# Patient Record
Sex: Male | Born: 1970 | Hispanic: Yes | Marital: Single
Health system: Southern US, Community
[De-identification: ages and names within clinical notes are randomized; demographics above are authoritative.]

## PROBLEM LIST (undated history)

## (undated) DIAGNOSIS — H903 Sensorineural hearing loss, bilateral: Secondary | ICD-10-CM

## (undated) DIAGNOSIS — I10 Essential (primary) hypertension: Secondary | ICD-10-CM

## (undated) DIAGNOSIS — H9313 Tinnitus, bilateral: Secondary | ICD-10-CM

---

## 2010-04-07 ENCOUNTER — Ambulatory Visit: Payer: Self-pay | Admitting: Internal Medicine

## 2010-04-07 ENCOUNTER — Encounter: Payer: Self-pay | Admitting: Family Medicine

## 2010-04-07 DIAGNOSIS — I10 Essential (primary) hypertension: Secondary | ICD-10-CM | POA: Insufficient documentation

## 2010-04-07 DIAGNOSIS — E785 Hyperlipidemia, unspecified: Secondary | ICD-10-CM

## 2010-04-10 ENCOUNTER — Ambulatory Visit: Payer: Self-pay | Admitting: Internal Medicine

## 2010-04-11 ENCOUNTER — Encounter: Payer: Self-pay | Admitting: Family Medicine

## 2010-04-11 LAB — CONVERTED CEMR LAB
Albumin: 4.5 g/dL (ref 3.5–5.2)
Alkaline Phosphatase: 59 units/L (ref 39–117)
Bilirubin, Direct: 0.2 mg/dL (ref 0.0–0.3)
Calcium: 9.1 mg/dL (ref 8.4–10.5)
GFR calc non Af Amer: 123.86 mL/min (ref >60)
Glucose, Bld: 113 mg/dL — ABNORMAL HIGH (ref 70–99)
HDL: 33.8 mg/dL — ABNORMAL LOW (ref >39.00)
Sodium: 141 meq/L (ref 135–145)
Total CHOL/HDL Ratio: 6
Triglycerides: 286 mg/dL — ABNORMAL HIGH (ref 0.0–149.0)

## 2010-05-06 ENCOUNTER — Ambulatory Visit: Payer: Self-pay | Admitting: Internal Medicine

## 2010-05-06 DIAGNOSIS — H60399 Other infective otitis externa, unspecified ear: Secondary | ICD-10-CM

## 2010-05-06 DIAGNOSIS — R7309 Other abnormal glucose: Secondary | ICD-10-CM | POA: Insufficient documentation

## 2010-05-06 DIAGNOSIS — E669 Obesity, unspecified: Secondary | ICD-10-CM | POA: Insufficient documentation

## 2010-05-06 HISTORY — DX: Other infective otitis externa, unspecified ear: H60.399

## 2010-09-30 NOTE — Assessment & Plan Note (Signed)
Summary: 4WK FOLLOW UP / LFW   Vital Signs:  Patient profile:   39 year old male Weight:      225.75 pounds Temp:     97.9 degrees F oral Pulse rate:   72 / minute Pulse rhythm:   regular BP sitting:   124 / 80  (left arm) Cuff size:   large  Vitals Entered By: Selena Batten Dance CMA Duncan Dull) (May 06, 2010 4:25 PM) CC: 4 week follow up   History of Present Illness: CC: f/u previous visit  Never received letter on blood work.  1. dyslipidemia - low HDL, high trig.  not currently on meds.  2. weight - up 4 lbs since last visit.  states hasn't really changed diet or exercise regimen (not doing anything currently).  3. HTN - on amlodipine.  no HA, vision changes, chest pain, tightness, SOB, urinary changes, LE swelling.  compliant with meds.  4. h/o recurrent ear infections - swimmers ears 3-4x/yr esp when out swimming, previously treated with cortisporin.  pt uses EtOH intermittently in ears, stopped H2O2.  5. prediabetes - cbg 113.    Allergies (verified): No Known Drug Allergies  Past History:  Past Medical History: HTN dyslipidemia (elevated trig, low HDL) recurrent otitis externa uses cortisporin ED previously on viagra  Review of Systems       per hpi  Physical Exam  General:  Well-developed,well-nourished,in no acute distress; alert,appropriate and cooperative throughout examination Ears:  External ear exam shows no significant lesions or deformities.  Otoscopic examination reveals tympanic membranes are intact bilaterally without bulging, retraction, inflammation or discharge. Hearing is grossly normal bilaterally.  R>L erythematous ear canals with maceration Neck:  No deformities, masses, or tenderness noted. Lungs:  Normal respiratory effort, chest expands symmetrically. Lungs are clear to auscultation, no crackles or wheezes. Heart:  Normal rate and regular rhythm. S1 and S2 normal without gallop, murmur, click, rub or other extra sounds.   Impression &  Recommendations:  Problem # 1:  HYPERTENSION (ICD-401.9) labs reviewed.  compliant and stable with norvasc 5. His updated medication list for this problem includes:    Norvasc 5 Mg Tabs (Amlodipine besylate) .Marland Kitchen... 1 by mouth once daily  BP today: 124/80 Prior BP: 132/92 (04/07/2010)  Labs Reviewed: K+: 4.3 (04/10/2010) Creat: : 0.7 (04/10/2010)   Chol: 189 (04/10/2010)   HDL: 33.80 (04/10/2010)   LDL: 143 (07/18/2008)   TG: 286.0 (04/10/2010)  Problem # 2:  OBESITY (ICD-278.00) Reviewed blood work, discussed healthy lifestyle changes, specifically mediterranean diet.  discussed implementing exercise into regimen.  pt thinks could start walking with family when he gets home from work.  RTC 1 mo for f/u weight and changes.    Ht: 69.50 (04/07/2010)   Wt: 225.75 (05/06/2010)   BMI: 32.39 (04/07/2010)  Problem # 3:  DYSLIPIDEMIA (ICD-272.4) return 1 mo for f/u.  could consider adding fish oil or niacin.  LDL at goal currently.  Labs Reviewed: SGOT: 20 (04/10/2010)   SGPT: 30 (04/10/2010)   HDL:33.80 (04/10/2010), 27 (07/18/2008)  LDL:143 (07/18/2008), 100 (06/11/2006)  Chol:189 (04/10/2010), 205 (07/18/2008)  Trig:286.0 (04/10/2010), 173 (07/18/2008)  Problem # 4:  OTITIS EXTERNA (ICD-380.10) Discussed symptomatic treatment and preventive measures.   His updated medication list for this problem includes:    Antibiotic Ear 3.5-10000-1 Soln (Neomycin-polymyxin-hc) .Marland Kitchen... Apply into ear 3-4 drops qid as needed ear infection.  label in spanish  Problem # 5:  PREDIABETES (ICD-790.29) cbg 113.  discussed risk of progression.  could consider metformin +/-  A1c. Labs Reviewed: Creat: 0.7 (04/10/2010)     Complete Medication List: 1)  Norvasc 5 Mg Tabs (Amlodipine besylate) .Marland Kitchen.. 1 by mouth once daily 2)  Ibuprofen 600 Mg Tabs (Ibuprofen) .... One by mouth q6 hours as needed pain.  label in spanish 3)  Antibiotic Ear 3.5-10000-1 Soln (Neomycin-polymyxin-hc) .... Apply into ear 3-4 drops qid  as needed ear infection.  label in spanish  Other Orders: Tdap => 98yrs IM (14782) Admin 1st Vaccine (95621) Flu Vaccine 64yrs + (30865) Admin of Any Addtl Vaccine (78469)  Patient Instructions: 1)  Dieta Mediterreana para presion y combatir diabetes. 2)  gotitas para oidos. 3)  Regresar como necesite o en 6 meses. (return in 6 months or as needed) 4)  Gusto verlo hoy.  Llame a clinica con preguntas. Prescriptions: ANTIBIOTIC EAR 3.5-10000-1 SOLN (NEOMYCIN-POLYMYXIN-HC) apply into ear 3-4 drops QID as needed ear infection.  label in spanish  #1 x 1   Entered and Authorized by:   Eustaquio Boyden  MD   Signed by:   Eustaquio Boyden  MD on 05/06/2010   Method used:   Electronically to        [facility] [phone]* (retail)       [address] [place], [place]  [address]       Ph: [phone] or [phone]       Fax: [phone]   RxID:   [id number]   Current Allergies (reviewed today): No known allergies    Prevention & Chronic Care Immunizations   Influenza vaccine: Fluvax 3+  (05/06/2010)   Influenza vaccine due: 05/01/2010    Tetanus booster: 05/06/2010: Tdap   Tetanus booster due: 05/06/2020    Pneumococcal vaccine: Not documented  Other Screening   Smoking status: never  (04/07/2010)  Lipids   Total Cholesterol: 189  (04/10/2010)   LDL: 143  (07/18/2008)   LDL Direct: 93.8  (04/10/2010)   HDL: 33.80  (04/10/2010)   Triglycerides: 286.0  (04/10/2010)    SGOT (AST): 20  (04/10/2010)   SGPT (ALT): 30  (04/10/2010)   Alkaline phosphatase: 59  (04/10/2010)   Total bilirubin: 1.0  (04/10/2010)  Hypertension   Last Blood Pressure: 124 / 80  (05/06/2010)   Serum creatinine: 0.7  (04/10/2010)   Serum potassium 4.3  (04/10/2010)  Self-Management Support :    Hypertension self-management support: Not documented    Lipid self-management support: Not documented    Immunizations Administered:  Tetanus Vaccine:    Vaccine  Type: Tdap    Site: left deltoid    Mfr: GlaxoSmithKline    Dose: 0.5 ml    Route: IM    Given by: Selena Batten Dance CMA (AAMA)    Exp. Date: 05/21/2012    Lot #: [id number]    VIS given: 07/18/08 version given May 06, 2010.  Influenza Vaccine # 1:    Vaccine Type: Fluvax 3+    Site: right deltoid    Mfr: GlaxoSmithKline    Dose: 0.5 ml    Route: IM    Given by: Janee Morn CMA (AAMA)    Exp. Date: 02/28/2011    Lot #: [id number]    VIS given: 03/25/10 version given May 06, 2010.  Flu Vaccine Consent Questions:    Do you have a history of severe allergic reactions to this vaccine? no    Any prior history of allergic reactions to egg and/or gelatin?  no    Do you have a sensitivity to the preservative Thimersol? no    Do you have a past history of Guillan-Barre Syndrome? no    Do you currently have an acute febrile illness? no    Have you ever had a severe reaction to latex? no    Vaccine information given and explained to patient? yes

## 2010-09-30 NOTE — Letter (Signed)
Summary: Out of Work  [facility] [employer] [facility]  [address] [place], [place] [address]   Phone: [phone]  Fax: [phone]    April 07, 2010   Employee:  JADIN CREQUE [facility]    To Whom It May Concern:   For Medical reasons, please excuse the above named employee the morning of:  Start:  April 10, 2010   End:  April 10, 2010   if he is a bit late to work.  He will be coming to clinic for blood work.  If you need additional information, please feel free to contact our office.         Sincerely,    Eustaquio Boyden  MD

## 2010-09-30 NOTE — Letter (Signed)
Summary: Lipid Letter  [place] at [facility]  [address] [place], [place] [address]   Phone: [phone]  Fax: [phone]    04/11/2010  Nathan Frost [address] [place], [place]  [address]  Dear Nathan Frost:  We have carefully reviewed your last lipid profile from  and the results are noted below with a summary of recommendations for lipid management.    Cholesterol:       189     Goal: < 200   HDL "good" Cholesterol:   95.18     Goal: > 40   LDL "bad" Cholesterol:   98     Goal: < 130   Triglycerides:       286.0     Goal: < 150    Sus triglycerides estuvieron altos, y su buen colesterol estuvo bajo.  Para esto recomiendo bajar la cantidad de grasa en la comida, tratar de comer mas frutas y vegetales, y mas pescado en la dieta.  Tambien recomendaria incrementar el ejercicio aerobico que hace regularmente.  Su tyroide, riones, e higado estuvieron completamente normales. Su azucar estuvo un poco alta - esto significa que usted tiene riezgo de desarrollar diabetes.  Seguiremos monitoreando esto de Loss adjuster, chartered.      TLC Diet (Therapeutic Lifestyle Change): Saturated Fats & Transfatty acids should be kept < 7% of total calories ***Reduce Saturated Fats Polyunstaurated Fat can be up to 10% of total calories Monounsaturated Fat Fat can be up to 20% of total calories Total Fat should be no greater than 25-35% of total calories Carbohydrates should be 50-60% of total calories Protein should be approximately 15% of total calories Fiber should be at least 20-30 grams a day ***Increased fiber may help lower LDL Total Cholesterol should be < 200mg /day Consider adding plant stanol/sterols to diet (example: Benacol spread) ***A higher intake of unsaturated fat may reduce Triglycerides and Increase HDL    Adjunctive Measures (may lower LIPIDS and reduce risk of Heart Attack) include: Aerobic Exercise (20-30 minutes 3-4 times a week) Limit Alcohol Consumption Weight  Reduction Aspirin 75-81 mg a day by mouth (if not allergic or contraindicated) Dietary Fiber 20-30 grams a day by mouth     Current Medications: 1)    Norvasc 5 Mg Tabs (Amlodipine besylate) .Marland Kitchen.. 1 by mouth once daily 2)    Ibuprofen 600 Mg Tabs (Ibuprofen) .... One by mouth q6 hours as needed pain.  label in spanish  If you have any questions, please call. We appreciate being able to work with you. Llame a clinica con preguntas.  Sincerely,    [place] at [facility] Eustaquio Boyden  MD  Appended Document: Lipid Letter Mailed to patient

## 2010-09-30 NOTE — Assessment & Plan Note (Signed)
Summary: new patient to est/mk   Vital Signs:  Patient profile:   40 year old male Height:      69.50 inches (176.53 cm) Weight:      221.75 pounds (100.80 kg) BMI:     32.39 Temp:     98.2 degrees F (36.78 degrees C) oral Pulse rate:   80 / minute Pulse rhythm:   regular BP sitting:   132 / 92  (left arm) Cuff size:   large  Vitals Entered By: Selena Batten Dance CMA Duncan Dull) (April 07, 2010 10:05 AM) CC: New patient to establish   History of Present Illness: CC: new patient establish  1. Welder, wants lungs checked.  2. joint pain - bilateral knee popping.  no pain.  Works standing.  No recent fevers.  3. HLD - h/o elevated, would like checked again.  thinks was on medicine for this.  BMI 32.  4. HTN - on amlodipine.  no HA, vision changes, chest pain, tightness, SOB, urinary changes, LE swelling.  5. h/o recurrent ear infections - swimmers ears 3-4x/yr esp when out swimming.  [facility] in [place].  Preventive Screening-Counseling & Management  Alcohol-Tobacco     Alcohol drinks/day: <1     Smoking Status: never  Caffeine-Diet-Exercise     Caffeine use/day: 1-2/day  Safety-Violence-Falls     Seat Belt Use: yes      Drug Use:  never.        Blood Transfusions:  no.    Current Medications (verified): 1)  Norvasc 5 Mg Tabs (Amlodipine Besylate) .Marland Kitchen.. 1 By Mouth Once Daily  Allergies (verified): No Known Drug Allergies  Past History:  Past Medical History: HTN ? HLD  Past Surgical History: none  Family History: Parents and uncles with Diabetes Grandmother with some sort of cancer.  No HLD, CAD/MI, CVA No other CA.  Social History: no smoker, 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Smoking Status:  never Caffeine use/day:  1-2/day Seat Belt Use:  yes Drug Use:  never Blood Transfusions:  no  Review of Systems  The patient denies anorexia, fever, weight loss, weight gain,  vision loss, decreased hearing, hoarseness, chest pain, syncope, dyspnea on exertion, peripheral edema, prolonged cough, headaches, hemoptysis, abdominal pain, melena, hematochezia, severe indigestion/heartburn, hematuria, incontinence, suspicious skin lesions, depression, and testicular masses.         + h/o ear infections in past  Physical Exam  General:  Well-developed,well-nourished,in no acute distress; alert,appropriate and cooperative throughout examination Head:  Normocephalic and atraumatic without obvious abnormalities. No apparent alopecia or balding. Eyes:  No corneal or conjunctival inflammation noted. EOMI. Perrla.  Ears:  External ear exam shows no significant lesions or deformities.  Otoscopic examination reveals clear canals, tympanic membranes are intact bilaterally without bulging, retraction, inflammation or discharge. Hearing is grossly normal bilaterally. Mouth:  Oral mucosa and oropharynx without lesions or exudates.  fair dentition.   Neck:  No deformities, masses, or tenderness noted. Lungs:  Normal respiratory effort, chest expands symmetrically. Lungs are clear to auscultation, no crackles or wheezes. Heart:  Normal rate and regular rhythm. S1 and S2 normal without gallop, murmur, click, rub or other extra sounds. Abdomen:  Bowel sounds positive,abdomen soft and non-tender without masses, organomegaly or hernias noted. Msk:  No deformity or scoliosis noted of thoracic or lumbar spine.  minimal knee crepitus bilaterally Pulses:  2+ periph pulses Extremities:  No clubbing, cyanosis, edema, or deformity noted with normal  full range of motion of all joints.   Skin:  Intact without suspicious lesions or rashes   Impression & Recommendations:  Problem # 1:  HYPERTENSION (ICD-401.9) obtain records from [facility] in [place].  Fasting blood work Thursday.  RTC 4-6 wks for f/u.  continue norvasc.  discussed low Na diet,  increase K rich foods. His updated  medication list for this problem includes:    Norvasc 5 Mg Tabs (Amlodipine besylate) .Marland Kitchen... 1 by mouth once daily  BP today: 132/92  Problem # 2:  HYPERLIPIDEMIA (ICD-272.4) per patient h/o this.  FLP this Thursday. Obtain records from [facility].  discussed increased exercise to combat this possiblilty.  Problem # 3:  HEALTH SCREENING (ICD-V70.0)  Reviewed preventive care protocols, scheduled due services, and updated immunizations.  await records, may need tetanus.  Complete Medication List: 1)  Norvasc 5 Mg Tabs (Amlodipine besylate) .Marland Kitchen.. 1 by mouth once daily 2)  Ibuprofen 600 Mg Tabs (Ibuprofen) .... One by mouth q6 hours as needed pain.  label in spanish  Patient Instructions: 1)  Please return later this week fasting for blood work [FLP, CMP 401.9, 272.4] 2)  Ibuprofeno para los talones.  Sus pulmones y rodillas se ven normales hoy.   3)  Para presion, menos sal en la dieta (no mas de 2gm de sal al dia - puede mirar ingredientes de comida que compre). 4)  Para cholesterol, perder peso con mas ejercicio diario. 5)  Llame a clinica con preguntas. 6)  Gusto conocerlo hoy. Prescriptions: NORVASC 5 MG TABS (AMLODIPINE BESYLATE) 1 by mouth once daily  #90 x 0   Entered and Authorized by:   Eustaquio Boyden  MD   Signed by:   Eustaquio Boyden  MD on 04/07/2010   Method used:   Print then Give to Patient   RxID:   [id number] IBUPROFEN 600 MG TABS (IBUPROFEN) one by mouth q6 hours as needed pain.  label in spanish  #30 x 0   Entered and Authorized by:   Eustaquio Boyden  MD   Signed by:   Eustaquio Boyden  MD on 04/07/2010   Method used:   Print then Give to Patient   RxID:   [id number]   Current Allergies (reviewed today): No known allergies   Appended Document: [facility]    Clinical Lists Changes  Observations: Added new observation of SOCIAL HX: remote smoker, no rec drugs, occ EtOH [employer] - [employer] Married, 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 (04/23/2010 8:12) Added new observation of PAST MED HX: HTN HLD (elevated trig, low HDL) recurrent otitis externa uses cortisporin ED previously on viagra (04/23/2010 8:12) Added new observation of CLO TEST: neg (06/25/2009 8:12) Added new observation of PSA: 0.7 ng/mL (04/25/2009 8:12) Added new observation of LDL: 143 mg/dL (95/28/4132 4:40) Added new observation of HDL: 27 mg/dL (06/27/2535 6:44) Added new observation of TRIGLYC TOT: 173 mg/dL (03/47/4259 5:63) Added new observation of CHOLESTEROL: 205 mg/dL (87/56/4332 9:51) Added new observation of CALCIUM: 9.3 mg/dL (88/41/6606 3:01) Added new observation of GFR: >59 mL/min (07/18/2008 8:12) Added new observation of CREATININE: 0.9 mg/dL (60/05/9322 5:57) Added new observation of BUN: 12 mg/dL (32/20/2542 7:06) Added new observation of BG RANDOM: 80 mg/dL (23/76/2831 5:17) Added new observation of K SERUM: 4.3 meq/L (07/18/2008 8:12) Added new observation of NA: 140 meq/L (07/18/2008 8:12) Added new observation of LDL: 100 mg/dL (61/60/7371 0:62) Added new observation of HDL: 30 mg/dL (69/48/5462 7:03) Added new  observation of TRIGLYC TOT: 250 mg/dL (19/14/7829 5:62) Added new observation of CHOLESTEROL: 180 mg/dL (13/03/6577 4:69) Added new observation of SGPT (ALT): 40 units/L (06/11/2006 8:12) Added new observation of SGOT (AST): 26 units/L (06/11/2006 8:12) Added new observation of ALK PHOS: 62 units/L (06/11/2006 8:12) Added new observation of BILI TOTAL: 1.2 mg/dL (62/95/2841 3:24)        Allergies: No Known Drug Allergies   Past History:  Past Medical History: HTN HLD (elevated trig, low HDL) recurrent otitis externa uses cortisporin ED previously on viagra   Social History: remote smoker, 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

## 2018-07-02 ENCOUNTER — Emergency Department: Payer: No Typology Code available for payment source

## 2018-07-02 ENCOUNTER — Emergency Department
Admission: EM | Admit: 2018-07-02 | Discharge: 2018-07-02 | Disposition: A | Discharge status: Home / Self Care | Payer: No Typology Code available for payment source | Attending: Emergency Medicine | Admitting: Emergency Medicine

## 2018-07-02 ENCOUNTER — Encounter: Payer: Self-pay | Admitting: Emergency Medicine

## 2018-07-02 ENCOUNTER — Other Ambulatory Visit: Payer: Self-pay

## 2018-07-02 DIAGNOSIS — E785 Hyperlipidemia, unspecified: Secondary | ICD-10-CM | POA: Diagnosis not present

## 2018-07-02 DIAGNOSIS — I10 Essential (primary) hypertension: Secondary | ICD-10-CM | POA: Insufficient documentation

## 2018-07-02 DIAGNOSIS — S46912A Strain of unspecified muscle, fascia and tendon at shoulder and upper arm level, left arm, initial encounter: Secondary | ICD-10-CM | POA: Insufficient documentation

## 2018-07-02 DIAGNOSIS — Y998 Other external cause status: Secondary | ICD-10-CM | POA: Insufficient documentation

## 2018-07-02 DIAGNOSIS — Y92410 Unspecified street and highway as the place of occurrence of the external cause: Secondary | ICD-10-CM | POA: Insufficient documentation

## 2018-07-02 DIAGNOSIS — S161XXA Strain of muscle, fascia and tendon at neck level, initial encounter: Secondary | ICD-10-CM | POA: Diagnosis not present

## 2018-07-02 DIAGNOSIS — Y939 Activity, unspecified: Secondary | ICD-10-CM | POA: Insufficient documentation

## 2018-07-02 DIAGNOSIS — S0990XA Unspecified injury of head, initial encounter: Secondary | ICD-10-CM | POA: Diagnosis present

## 2018-07-02 HISTORY — DX: Essential (primary) hypertension: I10

## 2018-07-02 MED ORDER — IBUPROFEN 600 MG PO TABS: 600.0000 mg | ORAL_TABLET | Freq: Four times a day (QID) | ORAL | 0 refills | Status: DC | PRN

## 2018-07-02 MED ORDER — CYCLOBENZAPRINE HCL 10 MG PO TABS: 10.0000 mg | ORAL_TABLET | Freq: Three times a day (TID) | ORAL | 0 refills | Status: DC | PRN

## 2018-07-02 MED ORDER — CYCLOBENZAPRINE HCL 10 MG PO TABS
10.0000 mg | ORAL_TABLET | Freq: Once | ORAL | Status: AC
  Administered 2018-07-02: 10 mg via ORAL
  Filled 2018-07-02: qty 1
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
## 2018-07-02 NOTE — ED Notes (Signed)
Patient verbalized understanding of discharge instructions, no questions. Patient ambulated out of ED with steady gait in no distress.  
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
## 2018-07-02 NOTE — Discharge Instructions (Signed)
Please follow up with the primary care provider if not improving over the week.  Return to the ER for symptoms that change or worsen if unable to schedule and appointment.
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
## 2023-12-15 ENCOUNTER — Ambulatory Visit (INDEPENDENT_AMBULATORY_CARE_PROVIDER_SITE_OTHER): Payer: BC Managed Care – PPO | Admitting: Gastroenterology

## 2023-12-15 ENCOUNTER — Encounter: Payer: Self-pay | Admitting: Gastroenterology

## 2023-12-15 ENCOUNTER — Other Ambulatory Visit: Payer: Self-pay

## 2023-12-15 VITALS — BP 148/97 | HR 88 | Temp 98.2°F | Ht 69.0 in | Wt 219.0 lb

## 2023-12-15 DIAGNOSIS — K625 Hemorrhage of anus and rectum: Secondary | ICD-10-CM

## 2023-12-15 DIAGNOSIS — R194 Change in bowel habit: Secondary | ICD-10-CM

## 2023-12-15 DIAGNOSIS — Z1211 Encounter for screening for malignant neoplasm of colon: Secondary | ICD-10-CM

## 2023-12-15 DIAGNOSIS — K644 Residual hemorrhoidal skin tags: Secondary | ICD-10-CM

## 2023-12-15 DIAGNOSIS — K6289 Other specified diseases of anus and rectum: Secondary | ICD-10-CM

## 2023-12-15 DIAGNOSIS — R198 Other specified symptoms and signs involving the digestive system and abdomen: Secondary | ICD-10-CM

## 2023-12-15 MED ORDER — NA SULFATE-K SULFATE-MG SULF 17.5-3.13-1.6 GM/177ML PO SOLN: 354.0000 mL | Freq: Once | ORAL | 0 refills | Status: AC

## 2023-12-15 NOTE — Patient Instructions (Signed)
 Take Miralax 1 cupful daily with a large glass of water.  Take Metamucil 1 cupful daily with a large glass of water.   Cmo tomar un bao de asiento How to Take a [employer] Un bao de asiento es un bao de agua tibia que se puede usar para cuidar el recto, la zona genital o la zona entre el recto y los genitales (perineo). En un bao de asiento, el agua solamente llega [employer] caderas y [place] las nalgas. Un bao de asiento puede hacerse en la baera o en una tina porttil para bao de asiento que se coloca sobre el inodoro. Su mdico puede recomendar un bao de asiento para ayudarlo con lo siguiente: Aliviar el dolor y las molestias despus de dar a Patent examiner. Aliviar el dolor y la picazn causados por las hemorroides o las fisuras anales. Aliviar el dolor despus de determinadas cirugas. Relajar los msculos doloridos o tensos. Cmo tomar un bao de [employer] 2 y 4 baos de asiento diarios, o tantos como le haya indicado el mdico. Bao de asiento en la baera Para tomar un bao de asiento en una baera: Llene parte de la baera con agua tibia. El agua debe tener la profundidad suficiente para cubrirle las caderas y las nalgas cuando est sentado en la baera. Siga las instrucciones de su mdico si le indica que ponga medicamentos en el agua. Sintese en el agua. Abra un poco el drenaje de la baera y djelo abierto durante su bao. Abra el agua tibia nuevamente, lo suficiente para reponer Firefighter. Deje correr el agua durante todo su bao. Esto ayuda a Surveyor, minerals en el nivel adecuado y Retail buyer. Sumrjase en el agua entre 15 y 20 minutos, o el tiempo que le haya indicado el mdico. Cuando termine, tenga cuidado al ponerse de pie. Puede sentirse mareado. Luego del bao de asiento, squese con golpecitos suaves. No frote la piel para secarla.  Bao de asiento sobre el inodoro Para tomar un bao de asiento con un recipiente sobre el inodoro: Siga las instrucciones  del fabricante. Llene el recipiente con agua tibia. Siga las instrucciones de su mdico si le indic que ponga medicamentos en el agua. Sintese en el asiento. Asegrese de que el agua le cubra las nalgas y el perineo. Sumrjase en el agua entre 15 y 20 minutos, o el tiempo que le haya indicado el mdico. Luego del bao de asiento, squese con golpecitos suaves. No frote la piel para secarla. Limpie y seque la tina despus de cada uso. Deseche el recipiente si se agrieta o segn las instrucciones del fabricante.  Comunquese con un mdico si: El dolor o la picazn [place]. Deje de hacerse baos de asiento si los sntomas [place]. Aparecen nuevos sntomas. Deje de hacerse baos de asiento hasta que hable con el mdico. Resumen Un bao de asiento es un bao con agua tibia en el cual el agua solo le llega hasta la cadera y cubre las nalgas. El mdico puede recomendarle un bao de asiento para ayudar a Engineer, materials y las molestias despus de un parto, Engineer, materials y la picazn de hemorroides o fisuras anales, Acupuncturist dolor despus de ciertas cirugas o contribuir a International aid/development worker los msculos doloridos o tensos. [employer] 2 y 4 baos de asiento diarios, o tantos como le haya indicado el mdico. Sumrjase en el agua entre 15 y 20 minutos. Deje de hacerse baos de asiento si los sntomas [place]. Esta informacin  no tiene Theme park manager el consejo del mdico. Asegrese de hacerle al mdico cualquier pregunta que tenga. Document Revised: 12/11/2021 Document Reviewed: 12/11/2021 Elsevier Patient Education  2024 Elsevier Inc.Plan de alimentacin con alto contenido de fibra High-Fiber Eating Plan [place], tambin llamada fibra alimentaria, se encuentra en alimentos como las frutas, las verduras, los cereales integrales y las legumbres. Una dieta con alto contenido de fibra puede ser buena para su salud. El mdico puede recomendar una dieta rica en fibra para ayudar a: Administrator, Civil Service  dificultad para defecar (estreimiento). Disminuir el nivel de colesterol. Tratar las siguientes afecciones: Hemorroides. Es la inflamacin de venas en el ano. Inflamacin de zonas especficas del tubo digestivo. Sndrome de colon irritable (SCI). Este es un problema del intestino grueso, tambin llamado colon, que a veces causa dolor en el vientre e hinchazn. Evitar comer en exceso como parte de un plan para bajar de peso. Disminuir el riesgo de enfermedad cardaca, diabetes tipo 2 y ciertos cnceres. Consejos para seguir Surveyor, minerals Al leer las etiquetas de los alimentos  Consulte la etiqueta de informacin nutricional en los alimentos para Solicitor la cantidad de [place]. Elija alimentos que tengan 4 gramos o ms de fibra por porcin. Los objetivos recomendados respecto de la cantidad de fibra que debe consumir por da incluyen: Hombres de 50 aos o menos: de 30 a 34 g. Hombres mayores de 50 aos: de 28 a 34 g. Mujeres de 50 aos o menos: de 25 a 28 g. Mujeres mayores de 50 aos: de 22 a 25 g. Su meta de cantidad de fibra diaria es ________________ g. Al ir de compras Elija frutas y verduras enteras en lugar de formas procesadas. Por ejemplo, elija manzanas en lugar de jugo de [place] o compota de [place]. Elija variedad de alimentos ricos en fibra, como aguacates, lentejas, avena y frijoles pinto. Lea la etiqueta de informacin nutricional de los alimentos. Preste atencin a los alimentos con Transport planner. Estos alimentos suelen tener altas cantidades de azcar y de sal (sodio) por porcin. Al cocinar Utilice harina integral para hornear y Water quality scientist. Cocine con arroz integral en lugar de con arroz blanco. Prepare comidas que contengan muchas legumbres y verduras, como [place] o sopas a base de verduras. Planificacin de las Management consultant con un desayuno con alto contenido de [place], como un cereal que tenga al menos 5 g de fibra o ms por porcin. Coma panes y cereales  hechos con harina de cereales integrales en lugar de harina refinada o blanca. Coma arroz integral, trigo burgol o mijo en lugar de arroz blanco. Use frijoles en lugar de carne en las sopas, ensaladas o platos de pastas. Asegrese de que al [employer] mitad de los granos que ingiere cada da sean integrales. Informacin general Puede consumir la cantidad recomendada de fibra alimentaria de la siguiente manera: Coma una variedad de frutas, verduras, granos, frutos secos y frijoles. Tome un suplemento de fibra si no puede comer suficiente fibra. Es mejor obtener fibra directamente de los alimentos en lugar de recibirla de suplementos de [place]. Aumente lentamente la cantidad de fibra que come. Si aumenta demasiado rpido el consumo de [place], puede provocar distensin abdominal, clicos o gases. Beba mucha agua para ayudar a Geophysicist/field seismologist. Haga colaciones con alto contenido de [place], como frutos rojos, verduras crudas, frutos secos y palomitas de maz. Qu alimentos debo comer? [place] Bayas. Peras. Manzanas. Naranjas. [place]. Ciruelas y pasas. Higos secos. Verduras Batatas. [place]. Col rizada. [place]ofas. Repollo. Brcoli.  Coliflor. Guisantes. Zanahorias. [place]. Granos Panes integrales. Cereal multigrano. Avena. Arroz integral. Anna [place]. Trigo burgol. Mijo. Quinua. Magdalenas de salvado. Palomitas de maz. Galletas de centeno. Carnes y otras protenas Frijoles azules, frijoles rojos y frijoles pintos. Soja. Guisantes secos. Lentejas. Frutos secos y semillas. Lcteos Yog[place] fortificado con Research scientist (life sciences). "Fortificado" significa que se le ha agregado fibra al producto. Bebidas Leche de soja fortificada con Marletta Simmering. Jugo de naranja fortificado con Marletta Simmering. Otros alimentos Barras de [place]. Es posible que los productos que se enumeran ms arriba no sean todos los alimentos y las bebidas que puede consumir. Consulte a un nutricionista para obtener ms informacin. Qu alimentos debo evitar? Frutas Jugo  de frutas. Frutas cocidas coladas. Verduras Papas fritas. Verduras enlatadas. Verduras muy cocidas. Granos Pan blanco. Pastas hechas con [place]. Arroz Malvina Searle y otras protenas Carnes grasas. Pollo o pescado fritos. Lcteos Leche. Queso crema. Renard Carolin. Grasas y [employer]. Bebidas Bebidas sin alcohol. Otros alimentos Tortas y pasteles. Es posible que los productos que se enumeran ms arriba no sean todos los alimentos y las bebidas que Personnel officer. Consulte a un nutricionista para obtener ms informacin. Esta informacin no tiene Theme park manager el consejo del mdico. Asegrese de hacerle al mdico cualquier pregunta que tenga. Document Revised: 12/23/2022 Document Reviewed: 12/23/2022 Elsevier Patient Education  2024 [employer].

## 2023-12-15 NOTE — Progress Notes (Signed)
 Nathan Repress, MD [address]  [address]  [place], [place] [address]  Main: [phone]  Fax: [phone]    Gastroenterology Consultation  Referring Provider:     Eustaquio Boyden, MD Primary Care Physician:  Eustaquio Boyden, MD Primary Gastroenterologist:  Dr. Arlyss Frost Reason for Consultation: Swelling in the rectum and rectal bleeding        HPI:   Nathan Frost is a 53 y.o. male referred by Dr. Eustaquio Boyden, MD  for consultation & management of few months history of episodic rectal pain, swelling and unable to sit down associated with irregular bowel movements.  He also reports significant abdominal bloating, feeling full and associated with straining although he denies any hard stools.  He does report intermittent flareup of the symptoms for which he tried hemorrhoidal cream.  He also notices bright red blood per rectum on wiping usually.  He denies any weight loss.  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  Does not drink adequate amount of water He does not smoke or drink alcohol He would like to undergo colonoscopy  NSAIDs: None  Antiplts/Anticoagulants/Anti thrombotics: None  GI Procedures: None He denies any family history of GI malignancy  Past Medical History:  Diagnosis Date   Hypertension     History reviewed. No pertinent surgical history.   Current Outpatient Medications:    losartan-hydrochlorothiazide (HYZAAR) 100-25 MG tablet, Take 1 tablet by mouth daily., Disp: , Rfl:    metFORMIN (GLUCOPHAGE) 500 MG tablet, Take 500 mg by mouth 2 (two) times daily., Disp: , Rfl:    Na Sulfate-K Sulfate-Mg Sulfate concentrate (SUPREP) 17.5-3.13-1.6 GM/177ML SOLN, Take 1 kit (354 mLs total) by mouth once for 1 dose., Disp: 354 mL, Rfl: 0   History reviewed. No pertinent family history.   Social History   Tobacco Use   Smoking status: Never   Smokeless tobacco: Never   Substance Use Topics   Alcohol use: Yes    Comment: once a month a couple beers   Drug use: Never    Allergies as of 12/15/2023   (No Known Allergies)    Review of Systems:    All systems reviewed and negative except where noted in HPI.   Physical Exam:  BP (!) 148/97 (BP Location: Left Arm, Patient Position: Sitting, Cuff Size: Normal)   Pulse 88   Temp 98.2 F (36.8 C) (Oral)   Ht 5\' 9"  (1.753 m)   Wt 219 lb (99.3 kg)   BMI 32.34 kg/m  No LMP for male patient.  General:   Alert,  Well-developed, well-nourished, pleasant and cooperative in NAD Head:  Normocephalic and atraumatic. Eyes:  Sclera clear, no icterus.   Conjunctiva pink. Ears:  Normal auditory acuity. Nose:  No deformity, discharge, or lesions. Mouth:  No deformity or lesions,oropharynx pink & moist. Neck:  Supple; no masses or thyromegaly. Lungs:  Respirations even and unlabored.  Clear throughout to auscultation.   No wheezes, crackles, or rhonchi. No acute distress. Heart:  Regular rate and rhythm; no murmurs, clicks, rubs, or gallops. Abdomen:  Normal bowel sounds. Soft, non-tender and non-distended without masses, hepatosplenomegaly or hernias noted.  No guarding or rebound tenderness.   Rectal: Prolapsed inflamed external hemorrhoid, nontender digital rectal exam Msk:  Symmetrical without gross deformities. Good, equal movement & strength bilaterally. Pulses:  Normal pulses noted. Extremities:  No clubbing or edema.  No cyanosis. Neurologic:  Alert  and oriented x3;  grossly normal neurologically. Skin:  Intact without significant lesions or rashes. No jaundice. Psych:  Alert and cooperative. Normal mood and affect.  Imaging Studies: No abdominal imaging  Assessment and Plan:   Nathan Frost is a 53 y.o. male with few months history of intermittent symptoms of rectal pain, swelling, pressure along with bright red blood per rectum and irregular bowel movements.  Symptoms are highly suggestive  of inflamed hemorrhoids Discussed about sitz bath Take Metamucil and MiraLAX 1 capful each with large cups of water daily Discussed about high-fiber diet, information provided Recommend screening colonoscopy   Follow up after the colonoscopy results   Karma Oz, MD

## 2023-12-16 ENCOUNTER — Encounter: Payer: Self-pay | Admitting: Gastroenterology

## 2023-12-16 NOTE — Anesthesia Preprocedure Evaluation (Addendum)
 Anesthesia Evaluation  Patient identified by MRN, date of birth, ID band Patient awake    Reviewed: Allergy & Precautions, H&P , NPO status , Patient's Chart, lab work & pertinent test results  Airway Mallampati: II  TM Distance: >3 FB Neck ROM: full    Dental no notable dental hx.    Pulmonary neg pulmonary ROS   Pulmonary exam normal        Cardiovascular hypertension, Normal cardiovascular exam     Neuro/Psych negative neurological ROS  negative psych ROS   GI/Hepatic negative GI ROS, Neg liver ROS,,,  Endo/Other    Renal/GU negative Renal ROS  negative genitourinary   Musculoskeletal   Abdominal  (+) + obese  Peds  Hematology negative hematology ROS (+)   Anesthesia Other Findings Pre-diabetes Hypertension  SNHL (sensory-neural hearing loss), asymmetrical  Tinnitus, bilateral    Reproductive/Obstetrics negative OB ROS                             Anesthesia Physical Anesthesia Plan  ASA: 2  Anesthesia Plan: General   Post-op Pain Management:    Induction:   PONV Risk Score and Plan: Propofol  infusion and TIVA  Airway Management Planned: Natural Airway  Additional Equipment:   Intra-op Plan:   Post-operative Plan:   Informed Consent: I have reviewed the patients History and Physical, chart, labs and discussed the procedure including the risks, benefits and alternatives for the proposed anesthesia with the patient or authorized representative who has indicated his/her understanding and acceptance.     Dental Advisory Given  Plan Discussed with: CRNA and Surgeon  Anesthesia Plan Comments:         Anesthesia Quick Evaluation

## 2023-12-20 ENCOUNTER — Encounter: Payer: Self-pay | Admitting: Gastroenterology

## 2023-12-20 ENCOUNTER — Encounter
Admission: RE | Disposition: A | Discharge status: Home / Self Care | Payer: Self-pay | Source: Home / Self Care | Attending: Gastroenterology

## 2023-12-20 ENCOUNTER — Ambulatory Visit
Admission: RE | Admit: 2023-12-20 | Discharge: 2023-12-20 | Disposition: A | Discharge status: Home / Self Care | Attending: Gastroenterology | Admitting: Gastroenterology

## 2023-12-20 ENCOUNTER — Ambulatory Visit: Payer: Self-pay | Admitting: Anesthesiology

## 2023-12-20 ENCOUNTER — Other Ambulatory Visit: Payer: Self-pay

## 2023-12-20 DIAGNOSIS — I10 Essential (primary) hypertension: Secondary | ICD-10-CM | POA: Diagnosis not present

## 2023-12-20 DIAGNOSIS — K642 Third degree hemorrhoids: Secondary | ICD-10-CM

## 2023-12-20 DIAGNOSIS — Z1211 Encounter for screening for malignant neoplasm of colon: Secondary | ICD-10-CM | POA: Diagnosis present

## 2023-12-20 DIAGNOSIS — K644 Residual hemorrhoidal skin tags: Secondary | ICD-10-CM | POA: Diagnosis not present

## 2023-12-20 HISTORY — PX: COLONOSCOPY: SHX5424

## 2023-12-20 HISTORY — DX: Tinnitus, bilateral: H93.13

## 2023-12-20 HISTORY — DX: Sensorineural hearing loss, bilateral: H90.3

## 2023-12-20 LAB — GLUCOSE, CAPILLARY: Glucose-Capillary: 102 mg/dL — ABNORMAL HIGH (ref 70–99)

## 2023-12-20 SURGERY — COLONOSCOPY
Anesthesia: General | Site: Rectum

## 2023-12-20 MED ORDER — PROPOFOL 500 MG/50ML IV EMUL
INTRAVENOUS | Status: DC | PRN
  Administered 2023-12-20: 75 ug/kg/min via INTRAVENOUS

## 2023-12-20 MED ORDER — LIDOCAINE HCL (PF) 2 % IJ SOLN
INTRAMUSCULAR | Status: AC
  Filled 2023-12-20: qty 5

## 2023-12-20 MED ORDER — SODIUM CHLORIDE 0.9 % IV SOLN
INTRAVENOUS | Status: DC
Start: 2023-12-20 — End: 2023-12-20

## 2023-12-20 MED ORDER — STERILE WATER FOR IRRIGATION IR SOLN
Status: DC | PRN
  Administered 2023-12-20: 1

## 2023-12-20 MED ORDER — STERILE WATER FOR IRRIGATION IR SOLN
Status: DC | PRN
  Administered 2023-12-20: 120 mL

## 2023-12-20 MED ORDER — PROPOFOL 10 MG/ML IV BOLUS
INTRAVENOUS | Status: DC | PRN
Start: 2023-12-20 — End: 2023-12-20
  Administered 2023-12-20 (×2): 50 mg via INTRAVENOUS

## 2023-12-20 MED ORDER — LIDOCAINE HCL (CARDIAC) PF 100 MG/5ML IV SOSY
PREFILLED_SYRINGE | INTRAVENOUS | Status: DC | PRN
  Administered 2023-12-20: 80 mg via INTRAVENOUS

## 2023-12-20 MED ORDER — DEXMEDETOMIDINE HCL IN NACL 80 MCG/20ML IV SOLN
INTRAVENOUS | Status: DC | PRN
  Administered 2023-12-20: 20 ug via INTRAVENOUS

## 2023-12-20 MED ORDER — PROPOFOL 10 MG/ML IV BOLUS
INTRAVENOUS | Status: AC
  Filled 2023-12-20: qty 20

## 2023-12-20 SURGICAL SUPPLY — 17 items
CLIP HMST 235XBRD CATH ROT (MISCELLANEOUS) IMPLANT
ELECT REM PT RETURN 9FT ADLT (ELECTROSURGICAL) IMPLANT
ELECTRODE REM PT RTRN 9FT ADLT (ELECTROSURGICAL) IMPLANT
FORCEPS BIOP RAD 4 LRG CAP 4 (CUTTING FORCEPS) IMPLANT
GAUZE SPONGE 4X4 12PLY STRL (GAUZE/BANDAGES/DRESSINGS) IMPLANT
GOWN CVR UNV OPN BCK APRN NK (MISCELLANEOUS) ×2 IMPLANT
INJECTOR VARIJECT VIN23 (MISCELLANEOUS) IMPLANT
KIT DEFENDO VALVE AND CONN (KITS) IMPLANT
KIT PRC NS LF DISP ENDO (KITS) ×1 IMPLANT
MANIFOLD NEPTUNE II (INSTRUMENTS) ×1 IMPLANT
MARKER SPOT ENDO TATTOO 5ML (MISCELLANEOUS) IMPLANT
PROBE APC STR FIRE (PROBE) IMPLANT
RETRIEVER NET ROTH 2.5X230 LF (MISCELLANEOUS) IMPLANT
SNARE COLD EXACTO (MISCELLANEOUS) IMPLANT
TRAP ETRAP POLY (MISCELLANEOUS) IMPLANT
VARIJECT INJECTOR VIN23 (MISCELLANEOUS) IMPLANT
WATER STERILE IRR 250ML POUR (IV SOLUTION) ×1 IMPLANT

## 2023-12-20 NOTE — Op Note (Signed)
 [facility] Gastroenterology Patient Name: Nathan Frost Procedure Date: 12/20/2023 10:45 AM MRN: [id number] Account #: [id number] Date of Birth: 06/29/1971 Admit Type: Outpatient Age: 53 Room: [facility] OR ROOM 01 Gender: Male Note Status: Finalized Instrument Name: [id number] Procedure:             Colonoscopy Indications:           Screening for colorectal malignant neoplasm Providers:             Selena Daily MD, MD Referring MD:          Claire Crick (Referring MD) Medicines:             General Anesthesia Complications:         No immediate complications. Estimated blood loss: None. Procedure:             Pre-Anesthesia Assessment:                        - Prior to the procedure, a History and Physical was                         performed, and patient medications and allergies were                         reviewed. The patient is competent. The risks and                         benefits of the procedure and the sedation options and                         risks were discussed with the patient. All questions                         were answered and informed consent was obtained.                         Patient identification and proposed procedure were                         verified by the physician, the nurse, the                         anesthesiologist, the anesthetist and the technician                         in the pre-procedure area in the procedure room in the                         endoscopy suite. Mental Status Examination: alert and                         oriented. Airway Examination: normal oropharyngeal                         airway and neck mobility. Respiratory Examination:                         clear to auscultation. CV Examination: normal.  Prophylactic Antibiotics: The patient does not require                         prophylactic antibiotics. Prior Anticoagulants: The                         patient  has taken no anticoagulant or antiplatelet                         agents. ASA Grade Assessment: II - A patient with mild                         systemic disease. After reviewing the risks and                         benefits, the patient was deemed in satisfactory                         condition to undergo the procedure. The anesthesia                         plan was to use general anesthesia. Immediately prior                         to administration of medications, the patient was                         re-assessed for adequacy to receive sedatives. The                         heart rate, respiratory rate, oxygen saturations,                         blood pressure, adequacy of pulmonary ventilation, and                         response to care were monitored throughout the                         procedure. The physical status of the patient was                         re-assessed after the procedure.                        After obtaining informed consent, the colonoscope was                         passed under direct vision. Throughout the procedure,                         the patient's blood pressure, pulse, and oxygen                         saturations were monitored continuously. The was                         introduced through the anus and advanced to the the  terminal ileum, with identification of the appendiceal                         orifice and IC valve. The colonoscopy was performed                         without difficulty. The patient tolerated the                         procedure well. The quality of the bowel preparation                         was evaluated using the BBPS [facility] Bowel Preparation                         Scale) with scores of: Right Colon = 3, Transverse                         Colon = 3 and Left Colon = 3 (entire mucosa seen well                         with no residual staining, small fragments of stool or                          opaque liquid). The total BBPS score equals 9. The                         terminal ileum, ileocecal valve, appendiceal orifice,                         and rectum were photographed. Findings:      Skin tags were found on perianal exam.      The terminal ileum appeared normal.      Non-bleeding external hemorrhoids were found during retroflexion. The       hemorrhoids were large. Impression:            - Perianal skin tags found on perianal exam.                        - The examined portion of the ileum was normal.                        - Non-bleeding external hemorrhoids.                        - No specimens collected. Recommendation:        - Discharge patient to home (with spouse).                        - Resume previous diet today.                        - Continue present medications.                        - Repeat colonoscopy in 10 years for screening  purposes.                        - Refer to a surgeon at appointment to be scheduled                         for removal of skin tags and hemorriodectomy. Procedure Code(s):     --- Professional ---                        Z6109, Colorectal cancer screening; colonoscopy on                         individual not meeting criteria for high risk Diagnosis Code(s):     --- Professional ---                        K64.4, Residual hemorrhoidal skin tags                        Z12.11, Encounter for screening for malignant neoplasm                         of colon CPT copyright 2022 American Medical Association. All rights reserved. The codes documented in this report are preliminary and upon coder review may  be revised to meet current compliance requirements. Dr. Evia Hof Selena Daily MD, MD 12/20/2023 11:26:09 AM This report has been signed electronically. Number of Addenda: 0 Note Initiated On: 12/20/2023 10:45 AM Scope Withdrawal Time: 0 hours 13 minutes 13 seconds  Total Procedure  Duration: 0 hours 16 minutes 36 seconds  Estimated Blood Loss:  Estimated blood loss: none.      [facility]

## 2023-12-20 NOTE — Transfer of Care (Signed)
 Immediate Anesthesia Transfer of Care Note  Patient: Nathan Frost  Procedure(s) Performed: COLONOSCOPY (Rectum)  Patient Location: PACU  Anesthesia Type: General  Level of Consciousness: awake, alert  and patient cooperative  Airway and Oxygen Therapy: Patient Spontanous Breathing and Patient connected to supplemental oxygen  Post-op Assessment: Post-op Vital signs reviewed, Patient's Cardiovascular Status Stable, Respiratory Function Stable, Patent Airway and No signs of Nausea or vomiting  Post-op Vital Signs: Reviewed and stable  Complications: No notable events documented.

## 2023-12-20 NOTE — H&P (Signed)
  Karma Oz, MD [address]  [address]  [place], [place] [address]  Main: [phone]  Fax: [phone] Pager: [phone]  Primary Care Physician:  Claire Crick, MD Primary Gastroenterologist:  Dr. Karma Oz  Pre-Procedure History & Physical: HPI:  Nathan Frost is a 53 y.o. male is here for an colonoscopy.   Past Medical History:  Diagnosis Date   Hypertension    Infective otitis externa 05/06/2010   Qualifier: Diagnosis of   By: Mariam Shingles  MD, Sharyle Deer SNOMED Dx Update Oct 2024     SNHL (sensory-neural hearing loss), asymmetrical    Tinnitus, bilateral     History reviewed. No pertinent surgical history.  Prior to Admission medications   Medication Sig Start Date End Date Taking? Authorizing Provider  losartan-hydrochlorothiazide (HYZAAR) 100-25 MG tablet Take 1 tablet by mouth daily. 09/14/23  Yes [provider]  metFORMIN (GLUCOPHAGE) 500 MG tablet Take 500 mg by mouth 2 (two) times daily. 09/14/23  Yes [provider]    Allergies as of 12/15/2023   (No Known Allergies)    History reviewed. No pertinent family history.  Social History   Socioeconomic History   Marital status: Single    Spouse name: Not on file   Number of children: Not on file   Years of education: Not on file   Highest education level: Not on file  Occupational History   Not on file  Tobacco Use   Smoking status: Never   Smokeless tobacco: Never  Substance and Sexual Activity   Alcohol use: Yes    Comment: once a month a couple beers   Drug use: Never   Sexual activity: Not on file  Other Topics Concern   Not on file  Social History Narrative   Not on file   Social Drivers of Health   Financial Resource Strain: Not on file  Food Insecurity: Not on file  Transportation Needs: Not on file  Physical Activity: Not on file  Stress: Not on file  Social Connections: Not on file  Intimate Partner Violence: Not on file     Review of Systems: See HPI, otherwise negative ROS  Physical Exam: BP (!) 142/90   Pulse 89   Temp 98 F (36.7 C) (Temporal)   Resp 18   Ht 5\' 9"  (1.753 m)   Wt 93.4 kg   SpO2 97%   BMI 30.42 kg/m  General:   Alert,  pleasant and cooperative in NAD Head:  Normocephalic and atraumatic. Neck:  Supple; no masses or thyromegaly. Lungs:  Clear throughout to auscultation.    Heart:  Regular rate and rhythm. Abdomen:  Soft, nontender and nondistended. Normal bowel sounds, without guarding, and without rebound.   Neurologic:  Alert and  oriented x4;  grossly normal neurologically.  Impression/Plan: Nathan Frost is here for an colonoscopy to be performed for colon cancer screening  Risks, benefits, limitations, and alternatives regarding  colonoscopy have been reviewed with the patient.  Questions have been answered.  All parties agreeable.   Ellis Guys, MD  12/20/2023, 10:10 AM

## 2023-12-20 NOTE — Anesthesia Postprocedure Evaluation (Signed)
 Anesthesia Post Note  Patient: Nathan Frost  Procedure(s) Performed: COLONOSCOPY (Rectum)  Patient location during evaluation: PACU Anesthesia Type: General Level of consciousness: awake and alert Pain management: pain level controlled Vital Signs Assessment: post-procedure vital signs reviewed and stable Respiratory status: spontaneous breathing, nonlabored ventilation and respiratory function stable Cardiovascular status: blood pressure returned to baseline and stable Postop Assessment: no apparent nausea or vomiting Anesthetic complications: no   No notable events documented.   Last Vitals:  Vitals:   12/20/23 1130 12/20/23 1145  BP: (!) 89/68 94/73  Pulse: 79 76  Resp: 16 17  Temp:  (!) 36.4 C  SpO2: 92% 98%    Last Pain:  Vitals:   12/20/23 1145  TempSrc:   PainSc: 0-No pain                 Baltazar Bonier
